# Patient Record
Sex: Female | Born: 2000 | Race: White | Hispanic: No | Marital: Single | Smoking: Never smoker
Health system: Southern US, Community
[De-identification: ages and names within clinical notes are randomized; demographics above are authoritative.]

## PROBLEM LIST (undated history)

## (undated) DIAGNOSIS — F633 Trichotillomania: Secondary | ICD-10-CM

## (undated) HISTORY — DX: Trichotillomania: F63.3

## (undated) HISTORY — PX: TYMPANOSTOMY TUBE PLACEMENT: SHX32

---
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
## 2019-03-02 ENCOUNTER — Telehealth: Payer: Self-pay | Admitting: *Deleted

## 2019-03-02 DIAGNOSIS — R300 Dysuria: Secondary | ICD-10-CM

## 2019-03-02 NOTE — Telephone Encounter (Signed)
Copied from [place] [id number]. Topic: Referral - Request for Referral >> Mar 02, 2019 12:27 PM Berneta Levins wrote: Has patient seen PCP for this complaint? yes *If NO, is insurance requiring patient see PCP for this issue before PCP can refer them? Referral for which specialty: urology Preferred provider/office: Bryna Colander at [facility] Reason for referral: having UTI symptoms but testing negative for UTI

## 2019-03-03 ENCOUNTER — Other Ambulatory Visit: Payer: Self-pay

## 2019-03-03 ENCOUNTER — Other Ambulatory Visit (HOSPITAL_COMMUNITY)
Admission: RE | Admit: 2019-03-03 | Discharge: 2019-03-03 | Disposition: A | Discharge status: Home / Self Care | Payer: No Typology Code available for payment source | Source: Ambulatory Visit | Attending: Family Medicine | Admitting: Family Medicine

## 2019-03-03 ENCOUNTER — Ambulatory Visit: Payer: No Typology Code available for payment source | Admitting: Family Medicine

## 2019-03-03 ENCOUNTER — Encounter: Payer: Self-pay | Admitting: Family Medicine

## 2019-03-03 VITALS — BP 108/70 | HR 95 | Temp 98.2°F | Wt 124.2 lb

## 2019-03-03 DIAGNOSIS — R300 Dysuria: Secondary | ICD-10-CM | POA: Diagnosis not present

## 2019-03-03 DIAGNOSIS — Z87440 Personal history of urinary (tract) infections: Secondary | ICD-10-CM | POA: Insufficient documentation

## 2019-03-03 LAB — POCT URINALYSIS DIPSTICK
Glucose, UA: POSITIVE — AB
Ketones, UA: NEGATIVE
Nitrite, UA: POSITIVE
Protein, UA: POSITIVE — AB
Spec Grav, UA: 1.015 (ref 1.010–1.025)
Urobilinogen, UA: 2 E.U./dL — AB
pH, UA: 5 (ref 5.0–8.0)

## 2019-03-03 LAB — POCT URINE PREGNANCY: Preg Test, Ur: NEGATIVE

## 2019-03-03 MED ORDER — CEFDINIR 300 MG PO CAPS: 300.0000 mg | ORAL_CAPSULE | Freq: Two times a day (BID) | ORAL | 0 refills | Status: DC

## 2019-03-03 NOTE — Progress Notes (Signed)
Subjective:    Patient ID: Teresa Harding, female    DOB: 2001-02-25, 18 y.o.   MRN: [id number]  HPI   Patient presents to clinic due to having painful urination for the past 2 or 3 days.  Has taken Azo to help combat this with some help in reducing symptoms.  Patient does have a history of recurrent UTIs.  Mom has history of interstitial cystitis.  Patient states she did see a urologist previously, but states she was just diagnosed with "bladder spasms".  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  No fever or chills.  No nausea, vomiting or diarrhea.  No severe abdominal pain.  Patient not currently sexually active, but has been previously.  At this time denies any known exposure to STDs.  Denies pregnancy possibility.  Patient Active Problem List   Diagnosis Date Noted  . Dysuria 08/09/2016   Social History   Tobacco Use  . Smoking status: Never Smoker  . Smokeless tobacco: Never Used  Substance Use Topics  . Alcohol use: No    Review of Systems   Constitutional: Negative for chills, fatigue and fever.  HENT: Negative for congestion, ear pain, sinus pain and sore throat.   Eyes: Negative.   Respiratory: Negative for cough, shortness of breath and wheezing.   Cardiovascular: Negative for chest pain, palpitations and leg swelling.  Gastrointestinal: Negative for abdominal pain, diarrhea, nausea and vomiting.  Genitourinary: Negative for dysuria, frequency and urgency.  Musculoskeletal: Negative for arthralgias and myalgias.  Skin: Negative for color change, pallor and rash.  Neurological: Negative for syncope, light-headedness and headaches.  Psychiatric/Behavioral: The patient is not nervous/anxious.       Objective:   Physical Exam Vitals signs and nursing note reviewed.  Constitutional:      General: She is not in acute distress.    Appearance: She is not ill-appearing or  toxic-appearing.  HENT:     Head: Normocephalic and atraumatic.  Cardiovascular:     Rate and Rhythm: Normal rate and regular rhythm.     Heart sounds: Normal heart sounds.  Pulmonary:     Effort: Pulmonary effort is normal. No respiratory distress.     Breath sounds: Normal breath sounds.  Abdominal:     General: Bowel sounds are normal. There is no distension.     Palpations: Abdomen is soft. There is no mass.     Tenderness: There is no abdominal tenderness. There is no guarding or rebound.     Hernia: No hernia is present.  Musculoskeletal:     Right lower leg: No edema.     Left lower leg: No edema.  Skin:    General: Skin is warm and dry.     Coloration: Skin is not jaundiced or pale.  Neurological:     Mental Status: She is alert and oriented to person, place, and time.     Gait: Gait normal.  Psychiatric:        Mood and Affect: Mood normal.        Behavior: Behavior normal.    Today's Vitals   03/03/19 1348  BP: 108/70  Pulse: 95  Temp: 98.2 F (36.8 C)  SpO2: 97%  Weight: 124 lb 3.2 oz (56.3 kg)       Assessment & Plan:     Recurrent UTI/dysuria-suspect patient may have interstitial cystitis.  Urinalysis is skewed by taking Azo, but  with her symptoms we will treat empirically with cefdinir twice daily for 5 days.  We will also send out testing for STDs.  Pregnancy is negative.  I will place referral to urology for further evaluation.  Also made patient aware that there is a urogynecology specialist we could consider in future if needed.  Aware that she will be contacted about Referral within 1 to 2 weeks and if she does not hear anything please let us know

## 2019-03-03 NOTE — Patient Instructions (Signed)
Interstitial Cystitis  Interstitial cystitis is inflammation of the bladder. This may cause pain in the bladder area as well as a frequent and urgent need to urinate. The bladder is a hollow organ in the lower part of the abdomen. It stores urine after the urine is made in the kidneys. The severity of interstitial cystitis can vary from person to person. You may have flare-ups, and then your symptoms may go away for a while. For many people, it becomes a long-term (chronic) problem. What are the causes? The cause of this condition is not known. What increases the risk? The following factors may make you more likely to develop this condition:  You are female.  You have fibromyalgia.  You have irritable bowel syndrome (IBS).  You have endometriosis. This condition may be aggravated by:  Stress.  Smoking.  Spicy foods. What are the signs or symptoms? Symptoms of interstitial cystitis vary, and they can change over time. Symptoms may include:  Discomfort or pain in the bladder area, which is in the lower abdomen. Pain can range from mild to severe. The pain may change in intensity as the bladder fills with urine or as it empties.  Pain in the pelvic area, between the hip bones.  An urgent need to urinate.  Frequent urination.  Pain during urination.  Pain during sex.  Blood in the urine. For women, symptoms often get worse during menstruation. How is this diagnosed? This condition is diagnosed based on your symptoms, your medical history, and a physical exam. You may have tests to rule out other conditions, such as:  Urine tests.  Cystoscopy. For this test, a tool similar to a very thin telescope is used to look into your bladder.  Biopsy. This involves taking a sample of tissue from the bladder to be examined under a microscope. How is this treated? There is no cure for this condition, but treatment can help you control your symptoms. Work closely with your health care  provider to find the most effective treatments for you. Treatment options may include:  Medicines to relieve pain and reduce how often you feel the need to urinate.  Learning ways to control when you urinate (bladder training).  Lifestyle changes, such as changing your diet or taking steps to control stress.  Using a device that provides electrical stimulation to your nerves, which can relieve pain (neuromodulation therapy). The device is placed on your back, where it blocks the nerves that cause you to feel pain in your bladder area.  A procedure that stretches your bladder by filling it with air or fluid.  Surgery. This is rare. It is only done for extreme cases, if other treatments do not help. Follow these instructions at home: Bladder training   Use bladder training techniques as directed. Techniques may include: ? Urinating at scheduled times. ? Training yourself to delay urination. ? Doing exercises (Kegel exercises) to strengthen the muscles that control urine flow.  Keep a bladder diary. ? Write down the times that you urinate and any symptoms that you have. This can help you find out which foods, liquids, or activities make your symptoms worse. ? Use your bladder diary to schedule bathroom trips. If you are away from home, plan to be near a bathroom at each of your scheduled times.  Make sure that you urinate just before you leave the house and just before you go to bed. Eating and drinking  Make dietary changes as recommended by your health care provider. You   may need to avoid: ? Spicy foods. ? Foods that contain a lot of potassium.  Limit your intake of beverages that make you need to urinate. These include: ? Caffeinated beverages like soda, coffee, and tea. ? Alcohol. General instructions  Take over-the-counter and prescription medicines only as told by your health care provider.  Do not drink alcohol.  You can try a warm or cool compress over your bladder for  comfort.  Avoid wearing tight clothing.  Do not use any products that contain nicotine or tobacco, such as cigarettes and e-cigarettes. If you need help quitting, ask your health care provider.  Keep all follow-up visits as told by your health care provider. This is important. Contact a health care provider if you have:  Symptoms that do not get better with treatment.  Pain or discomfort that gets worse.  More frequent urges to urinate.  A fever. Get help right away if:  You have no control over when you urinate. Summary  Interstitial cystitis is inflammation of the bladder.  This condition may cause pain in the bladder area as well as a frequent and urgent need to urinate.  You may have flare-ups of the condition, and then it may go away for a while. For many people, it becomes a long-term (chronic) problem.  There is no cure for interstitial cystitis, but treatment methods are available to control your symptoms. This information is not intended to replace advice given to you by your health care provider. Make sure you discuss any questions you have with your health care provider. Document Released: 12/16/2003 Document Revised: 03/29/2017 Document Reviewed: 03/11/2017 Elsevier Patient Education  2020 [employer]  

## 2019-03-03 NOTE — Telephone Encounter (Signed)
Referral placed. Please find out what symptoms she has been having. When was she last evaluated for a UTI?

## 2019-03-05 LAB — URINE CULTURE
MICRO NUMBER:: 1059458
Result:: NO GROWTH
SPECIMEN QUALITY:: ADEQUATE

## 2019-03-06 LAB — URINE CYTOLOGY ANCILLARY ONLY
Bacterial Vaginitis-Urine: NEGATIVE
Candida Urine: NEGATIVE
Chlamydia: NEGATIVE
Comment: NEGATIVE
Comment: NEGATIVE
Comment: NORMAL
Neisseria Gonorrhea: NEGATIVE
Trichomonas: NEGATIVE

## 2019-04-03 NOTE — Telephone Encounter (Signed)
Patient has appointment with urologist 12/9.

## 2019-04-08 ENCOUNTER — Ambulatory Visit (INDEPENDENT_AMBULATORY_CARE_PROVIDER_SITE_OTHER): Payer: No Typology Code available for payment source | Admitting: Urology

## 2019-04-08 ENCOUNTER — Other Ambulatory Visit: Payer: Self-pay

## 2019-04-08 VITALS — BP 113/77 | HR 93 | Ht 62.0 in | Wt 125.0 lb

## 2019-04-08 DIAGNOSIS — N941 Unspecified dyspareunia: Secondary | ICD-10-CM

## 2019-04-08 DIAGNOSIS — R801 Persistent proteinuria, unspecified: Secondary | ICD-10-CM | POA: Diagnosis not present

## 2019-04-08 DIAGNOSIS — R102 Pelvic and perineal pain: Secondary | ICD-10-CM

## 2019-04-08 DIAGNOSIS — Z87440 Personal history of urinary (tract) infections: Secondary | ICD-10-CM

## 2019-04-08 NOTE — Progress Notes (Signed)
04/08/2019 8:49 AM   Teresa Harding [birth year] [id number]  Referring provider: Leone Haven, MD [address] [place],   [address]  Chief Complaint  Patient presents with  . Recurrent UTI    HPI: 18 year old female referred for further evaluation of urinary symptoms.  She reports for the past several years, she has been experiencing dysuria and pelvic pain along with urinary frequency.  She was seen by pediatric urologist a few years ago at [facility] although the notes are not available for this.  She reports that she was diagnosed with bladder spasms and never really followed up.  She reports that she has burning with urination which often is associated and around the time of her menstrual cycle, a week before or after.  She reports that this burning is external and in her urethra.  She also has some discomfort/fullness right before she voids which often is relieved by voiding but hurts for a few minutes after.  She reports that she has been checked on multiple occasions for urinary tract infections but almost on every occasion, she does not actually have an infection.  She was seen and evaluated by her primary care physician on 03/2019 with urinary symptoms including dysuria.  Urinalysis at the time was a urine dip only which showed glucose, protein, urobilinogen, and large leukocytes with nitrate positive however the patient was taking Azo at the time.  No microscopic evaluation was performed.  Her urine culture was negative.  STI testing also negative.  She also pain with sexual intercourse.  She reports feeling pain in her adnexa bilaterally which is deep and intense with almost every sexual encounter involving penetration.  She is not currently sexually active but this can be bothersome to her.  She is currently menstruating and would prefer to defer pelvic exam today.  She denies constipation.  She does believe that she had gross hematuria once.  She showed  me pictures today of orange/reddish tinged urine with a small stringy clot on 12 fever.  No history of pyelonephritis or flank pain.  No history of kidney stones.   PMH: Past Medical History:  Diagnosis Date  . Trichotillomania     Surgical History: Past Surgical History:  Procedure Laterality Date  . TYMPANOSTOMY TUBE PLACEMENT      Home Medications:  Allergies as of 04/08/2019   No Known Allergies     Medication List       Accurate as of April 08, 2019 11:59 PM. If you have any questions, ask your nurse or doctor.        cefdinir 300 MG capsule Commonly known as: OMNICEF Take 1 capsule (300 mg total) by mouth 2 (two) times daily.   etonogestrel-ethinyl estradiol 0.12-0.015 MG/24HR vaginal ring Commonly known as: [place] 1 each vaginally every 28 (twenty-eight) days. Insert vaginally and leave in place for 3 consecutive weeks, then remove for 1 week.   ibuprofen 200 MG tablet Commonly known as: ADVIL Take 200 mg by mouth every 6 (six) hours as needed. For pain or fever       Allergies: No Known Allergies  Family History: Family History  Problem Relation Age of Onset  . Non-Hodgkin's lymphoma Maternal Grandfather   . Stroke Paternal Grandfather   . Heart attack Paternal Grandfather     Social History:  reports that she has never smoked. She has never used smokeless tobacco. She reports that she does not drink alcohol or use drugs.  ROS: UROLOGY Frequent Urination?:  Yes Hard to postpone urination?: No Burning/pain with urination?: Yes Get up at night to urinate?: No Leakage of urine?: No Urine stream starts and stops?: No Trouble starting stream?: No Do you have to strain to urinate?: No Blood in urine?: No Urinary tract infection?: No Sexually transmitted disease?: No Injury to kidneys or bladder?: No Painful intercourse?: Yes Weak stream?: No Currently pregnant?: No Vaginal bleeding?: No Last menstrual period?: n  Gastrointestinal  Nausea?: No Vomiting?: No Indigestion/heartburn?: No Diarrhea?: No Constipation?: No  Constitutional Fever: No Night sweats?: No Weight loss?: No Fatigue?: No  Skin Skin rash/lesions?: No Itching?: No  Eyes Blurred vision?: No Double vision?: No  Ears/Nose/Throat Sore throat?: No Sinus problems?: No  Hematologic/Lymphatic Swollen glands?: No Easy bruising?: No  Cardiovascular Leg swelling?: No Chest pain?: No  Respiratory Cough?: No Shortness of breath?: No  Endocrine Excessive thirst?: No  Musculoskeletal Back pain?: No Joint pain?: No  Neurological Headaches?: No Dizziness?: No  Psychologic Depression?: No Anxiety?: No  Physical Exam: BP 113/77   Pulse 93   Ht 5\' 2"  (1.575 m)   Wt 125 lb (56.7 kg)   BMI 22.86 kg/m   Constitutional:  Alert and oriented, No acute distress. HEENT: [place] AT, moist mucus membranes.  Trachea midline, no masses. Cardiovascular: No clubbing, cyanosis, or edema. Respiratory: Normal respiratory effort, no increased work of breathing. GI: Abdomen is soft, nontender, nondistended, no abdominal masses Skin: No rashes, bruises or suspicious lesions. Neurologic: Grossly intact, no focal deficits, moving all 4 extremities. Psychiatric: Normal mood and affect.  Urinalysis Results for orders placed or performed in visit on 04/08/19  Microscopic Examination   URINE  Result Value Ref Range   WBC, UA 0-5 0 - 5 /hpf   RBC None seen 0 - 2 /hpf   Epithelial Cells (non renal) 0-10 0 - 10 /hpf   Mucus, UA Present (A) Not Estab.   Bacteria, UA Few None seen/Few  Urinalysis, Complete  Result Value Ref Range   Specific Gravity, UA >1.030 (H) 1.005 - 1.030   pH, UA 5.5 5.0 - 7.5   Color, UA Yellow Yellow   Appearance Ur Cloudy (A) Clear   Leukocytes,UA Negative Negative   Protein,UA 1+ (A) Negative/Trace   Glucose, UA Negative Negative   Ketones, UA Negative Negative   RBC, UA 1+ (A) Negative   Bilirubin, UA Negative Negative    Urobilinogen, Ur 0.2 0.2 - 1.0 mg/dL   Nitrite, UA Negative Negative   Microscopic Examination See below:     Pertinent Imaging: n/a  Assessment & Plan:    1. History of recurrent UTIs Based on above history and review of multiple urinalysis/urine culture, do not feel that she likely has true urinary tract infections.  See below.  We discussed that obtaining UA/urine culture when she is symptomatic can be helpful in ruling the above out as underlying issue.  I encouraged her to pursue same-day visits to our office when she think she is having urinary tract infection to help rule this out as underlying cause.  She is agreeable this plan.  Weight today is fairly unremarkable. - Urinalysis, Complete  2. Pelvic pain in female/ dysuria/ possible IC We discussed the diease pathophysiology is poorly understood therefore treatment has been focused primarily on symptomatic relief as well as dietary and behavioral modification. Information pamphlets were reviewed and given today discussing the current understanding of the syndrome as well as treatment options. IC dietary information also given today as many patients experience relief  with simple lifestyle modifications.     If conservative management fails, will consider further work up with cystoscopy to access for Hunter's ulcers or other more aggressive treatments, however, response to each of these interventions is highly variable.   She has been using Pyridium as needed.  We discussed transition to Uribel which has some antispasmodic properties as well when can be used more frequently.  Samples were given today and prescription was called into the pharmacy.  We will escalate treatments if her symptoms worsen or do not respond to the above interventions.   3. Dyspareunia in female Plan for pelvic exam to rule out any underlying issues-deferred today due to menstruation  May be related to #2  Not currently sexually active thus will hold  off on any further intervention/treatment at this time but may consider pelvic floor therapy versus vaginal Valium down the road to see if this makes any difference  4. Persistent proteinuria Proteinuria on urine dip today which has also been noted on multiple previous urinalysis  Unclear whether or not this may be related to contamination and thus will obtain a cath specimen of time of pelvic, consider referral to nephrologist if she has any significant protein on that specimen   Return in about 1 month (around 05/09/2019) for Follow up with shannon or sam wit a cath  and pelvic.  Vanna Scotland, MD  [facility] [address], [address] [place], [place] [address] [phone]

## 2019-04-09 LAB — URINALYSIS, COMPLETE
Bilirubin, UA: NEGATIVE
Glucose, UA: NEGATIVE
Ketones, UA: NEGATIVE
Leukocytes,UA: NEGATIVE
Nitrite, UA: NEGATIVE
Specific Gravity, UA: >1.03 — ABNORMAL HIGH (ref 1.005–1.030)
Urobilinogen, Ur: 0.2 mg/dL (ref 0.2–1.0)
pH, UA: 5.5 (ref 5.0–7.5)

## 2019-04-09 LAB — MICROSCOPIC EXAMINATION: RBC: NONE SEEN /hpf (ref 0–2)

## 2019-04-10 MED ORDER — URIBEL 118 MG PO CAPS: 1.0000 | ORAL_CAPSULE | Freq: Four times a day (QID) | ORAL | 1 refills | Status: AC | PRN

## 2019-04-21 ENCOUNTER — Encounter: Payer: Self-pay | Admitting: Physician Assistant

## 2019-04-21 ENCOUNTER — Other Ambulatory Visit: Payer: Self-pay

## 2019-04-21 ENCOUNTER — Ambulatory Visit (INDEPENDENT_AMBULATORY_CARE_PROVIDER_SITE_OTHER): Payer: No Typology Code available for payment source | Admitting: Physician Assistant

## 2019-04-21 VITALS — BP 127/78 | HR 99 | Ht 62.0 in | Wt 125.0 lb

## 2019-04-21 DIAGNOSIS — Z87440 Personal history of urinary (tract) infections: Secondary | ICD-10-CM

## 2019-04-21 DIAGNOSIS — R801 Persistent proteinuria, unspecified: Secondary | ICD-10-CM | POA: Diagnosis not present

## 2019-04-21 DIAGNOSIS — R3915 Urgency of urination: Secondary | ICD-10-CM

## 2019-04-21 DIAGNOSIS — R102 Pelvic and perineal pain: Secondary | ICD-10-CM | POA: Diagnosis not present

## 2019-04-21 LAB — MICROSCOPIC EXAMINATION
Bacteria, UA: NONE SEEN
RBC: NONE SEEN /hpf (ref 0–2)

## 2019-04-21 LAB — URINALYSIS, COMPLETE
Bilirubin, UA: NEGATIVE
Glucose, UA: NEGATIVE
Ketones, UA: NEGATIVE
Leukocytes,UA: NEGATIVE
Nitrite, UA: NEGATIVE
Protein,UA: NEGATIVE
RBC, UA: NEGATIVE
Specific Gravity, UA: 1.025 (ref 1.005–1.030)
Urobilinogen, Ur: 0.2 mg/dL (ref 0.2–1.0)
pH, UA: 5.5 (ref 5.0–7.5)
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
## 2019-04-24 LAB — CULTURE, URINE COMPREHENSIVE

## 2019-05-11 ENCOUNTER — Ambulatory Visit: Payer: No Typology Code available for payment source | Attending: Physician Assistant

## 2019-05-11 ENCOUNTER — Other Ambulatory Visit: Payer: Self-pay

## 2019-05-11 DIAGNOSIS — M62838 Other muscle spasm: Secondary | ICD-10-CM

## 2019-05-11 DIAGNOSIS — M533 Sacrococcygeal disorders, not elsewhere classified: Secondary | ICD-10-CM | POA: Diagnosis present

## 2019-05-11 NOTE — Patient Instructions (Signed)
Intimate Rose internal trigger point release tool [phone]  Dr. Alonna Minium Premium Prostate Massager   $19.99  Stabilization: Diaphragmatic Breathing    Lie with knees bent, feet flat. Place one hand on stomach, other on chest. Breathe deeply through nose, lifting belly hand without any motion of hand on chest. For ~ 5 min. Per night and throughout the day as much as you can remember, especially when you feel stressed.
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
## 2019-05-18 ENCOUNTER — Other Ambulatory Visit: Payer: Self-pay

## 2019-05-18 ENCOUNTER — Ambulatory Visit: Payer: No Typology Code available for payment source

## 2019-05-18 DIAGNOSIS — M62838 Other muscle spasm: Secondary | ICD-10-CM

## 2019-05-18 DIAGNOSIS — M533 Sacrococcygeal disorders, not elsewhere classified: Secondary | ICD-10-CM
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
## 2019-05-18 NOTE — Patient Instructions (Signed)
   Hold for 30 seconds (5 deep breaths) and repeat 2-3 times on each side once a day   Flexors, Lunge  Hip Flexor Stretch: Proposal Pose    Maintain pelvic tuck under, lift pubic bone toward navel. Engage posterior hip muscles (firm glute muscles of leg in back position) and shift forward until you feel stretch on front of leg that is down. To increase stretch, maintain balance and ease hips forward. You may use one hand on a chair for balance if needed. Hold for __5__ breaths. Repeat __2-3__ times each leg.  Do _1-2__ times per day.    When seated, you want to maintain pelvic neutral with the shoulders gently down and back and ears in line with your shoulders. A lumbar roll such as the one below or a home-made towel-roll can be used for this purpose. Even Olympic athletes can only maintain proper seated posture for about 10 minutes without support!    Pictured: The Original [employer] Lumbar Roll

## 2019-05-25 ENCOUNTER — Other Ambulatory Visit: Payer: Self-pay

## 2019-05-25 ENCOUNTER — Ambulatory Visit: Payer: No Typology Code available for payment source

## 2019-05-25 DIAGNOSIS — M62838 Other muscle spasm: Secondary | ICD-10-CM | POA: Diagnosis not present

## 2019-05-25 DIAGNOSIS — M533 Sacrococcygeal disorders, not elsewhere classified: Secondary | ICD-10-CM
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
## 2019-05-25 NOTE — Patient Instructions (Addendum)
Self External Trigger Point Relief    1) Wash your hands and prop yourself up in a way where you can easily reach the vagina. You may wish to have a small hand-held mirror near by.  2) Use the 2 middle fingers to put gentle pressure on the three external pelvic floor muscles and hold pressure and take deep breaths as you allow the tension to release and discomfort to dissipate   3) Repeat the process for any trigger points you find spending between 3-10 minutes on this every 1-2 days until you do not find any more trigger points or you are told otherwise by your therapist.  Self Internal Trigger Point Relief    1) Wash your hands and prop yourself up in a way where you can easily reach the vagina. You may wish to have a small hand-held mirror near by.  2) lubricate the tool and vaginal opening using a hypoallergenic lubricant such as "slippery-stuff".   3) Slowly and gently insert the tool into the vagina using deep breaths to allow relaxation of the muscles around the tool.  4) Avoiding the "12 o-clock" region near the urethra, gently use the handle of the tool like a lever to press the angled tip of the tool onto the wall of the pelvic floor.   5) Move the tool to different areas of the pelvic floor and feel for areas that are tender called "trigger points". When you find one hold the tool still, applying just enough pressure to elicit mild discomfort and take deep belly breaths until the discomfort subsides or decreases by at least 50%.   6) Repeat the process for any trigger points you find spending between 3-10 minutes on this per night until you do not find any more trigger points or you are told otherwise by your therapist..  3-Way Wall Stretches for Pelvic Floor Lengthening   Bring bottom close to the wall and gently press straighten knees to feel a stretch down the back of you thighs. Hold while taking 5 deep belly breaths and feeling the pelvic floor relax and lower on each  inhale.    Let your legs fall to the side to feel a stretch on the inside of your thighs. If the stretch is too intense you can use a pillow to take some of the weight off by wedging it on the outside of your hips. Hold while taking 5 deep belly breaths and feeling the pelvic floor relax and lower on each inhale.    Slide feet down the wall and move hips slightly away from the wall and then let your knees fall to the sides so you feel a stretch on the inside of the thighs near your groin. Hold while taking 5 deep belly breaths and feeling the pelvic floor relax and lower on each inhale.     *Perform each stretch in sequence 3 times, once a day

## 2019-06-01 ENCOUNTER — Other Ambulatory Visit: Payer: Self-pay

## 2019-06-01 ENCOUNTER — Ambulatory Visit: Payer: PRIVATE HEALTH INSURANCE | Attending: Physician Assistant

## 2019-06-01 DIAGNOSIS — M533 Sacrococcygeal disorders, not elsewhere classified: Secondary | ICD-10-CM | POA: Diagnosis present

## 2019-06-01 DIAGNOSIS — M62838 Other muscle spasm: Secondary | ICD-10-CM | POA: Diagnosis present
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
## 2019-06-08 ENCOUNTER — Ambulatory Visit: Payer: PRIVATE HEALTH INSURANCE

## 2019-06-08 ENCOUNTER — Other Ambulatory Visit: Payer: Self-pay

## 2019-06-08 DIAGNOSIS — M62838 Other muscle spasm: Secondary | ICD-10-CM

## 2019-06-08 DIAGNOSIS — M533 Sacrococcygeal disorders, not elsewhere classified: Secondary | ICD-10-CM

## 2019-06-08 NOTE — Patient Instructions (Signed)
Urge supression technique:  1) Take a deep breath to convince yourself that you are in control and calm the nervous system.  2) Do 5 "quick-flick" kegels (pelvic floor muscle contractions) and re-assess the urge. Repeat another set if urge is still present. 3) Once the urge has decreased, start walking calmly to the the bathroom. Stop and repeat steps 1 and 2 as many times as needed until you can successfully get to the toilet. 4) Only once seated, take a deep breath and allow the pelvic floor muscles to relax and allow for the urine to flow.    Do not be discouraged if you are not successful the first couple times, this is normal and it will take practice but remember that YOU are in control. Start by practicing this at home where you do not have to worry as much if there were to be an accident. Allowing yourself to get rushed or nervous puts the bladder back in control and will not allow the technique to work.   **try to dampen the "1st urge, always listen to the 2nd urge" Normal is every 2-3 hours.
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
## 2019-06-15 ENCOUNTER — Other Ambulatory Visit: Payer: Self-pay

## 2019-06-15 ENCOUNTER — Ambulatory Visit: Payer: PRIVATE HEALTH INSURANCE

## 2019-06-15 DIAGNOSIS — M533 Sacrococcygeal disorders, not elsewhere classified: Secondary | ICD-10-CM

## 2019-06-15 DIAGNOSIS — M62838 Other muscle spasm: Secondary | ICD-10-CM | POA: Diagnosis not present
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
## 2019-06-22 ENCOUNTER — Ambulatory Visit: Payer: PRIVATE HEALTH INSURANCE

## 2019-06-22 ENCOUNTER — Other Ambulatory Visit: Payer: Self-pay

## 2019-06-22 DIAGNOSIS — M62838 Other muscle spasm: Secondary | ICD-10-CM

## 2019-06-22 DIAGNOSIS — M533 Sacrococcygeal disorders, not elsewhere classified: Secondary | ICD-10-CM

## 2019-06-22 NOTE — Patient Instructions (Signed)
1) Make sure that your low back stays in contact with the floor/mat when you do any abdominal exercise on your back so you use your deep-core as well as your "show-muscles".  2) Keep your trunk as one unit and let it hinge forward from the hips as you push your bottom back and bend your knees at the same rate that you bend your hips. Keep your weight back toward your heels but do not actually lift the toes off the ground. Exhale starting just before and all the way through standing to help engage the glutes and lower tummy muscles.
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
## 2019-06-29 ENCOUNTER — Ambulatory Visit: Payer: No Typology Code available for payment source | Attending: Physician Assistant

## 2019-06-29 DIAGNOSIS — M533 Sacrococcygeal disorders, not elsewhere classified: Secondary | ICD-10-CM | POA: Insufficient documentation

## 2019-06-29 DIAGNOSIS — M62838 Other muscle spasm: Secondary | ICD-10-CM | POA: Insufficient documentation

## 2019-07-06 ENCOUNTER — Ambulatory Visit: Payer: No Typology Code available for payment source

## 2019-07-06 ENCOUNTER — Other Ambulatory Visit: Payer: Self-pay

## 2019-07-06 DIAGNOSIS — M533 Sacrococcygeal disorders, not elsewhere classified: Secondary | ICD-10-CM | POA: Diagnosis present

## 2019-07-06 DIAGNOSIS — M62838 Other muscle spasm: Secondary | ICD-10-CM | POA: Diagnosis present

## 2019-07-06 NOTE — Patient Instructions (Signed)
  Take this at least while you are taking the antibiotic to prevent UTI.
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
## 2019-07-13 ENCOUNTER — Other Ambulatory Visit: Payer: Self-pay

## 2019-07-13 ENCOUNTER — Ambulatory Visit: Payer: No Typology Code available for payment source

## 2019-07-13 DIAGNOSIS — M62838 Other muscle spasm: Secondary | ICD-10-CM

## 2019-07-13 DIAGNOSIS — M533 Sacrococcygeal disorders, not elsewhere classified: Secondary | ICD-10-CM
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
## 2019-07-13 NOTE — Patient Instructions (Signed)
~   20 min. At night   Hold for 5 seep breaths, repeat 2-3 times.   Hold for 5 deep breaths, repeat 2-3 times on each side.     Roll back and forth immediately after a run to help flush the lactic acid out. Hold on any area that feels tight/tender for long enough for the tendersess to decrease at least 50% (or all the way if not longer than ~ 3 min.)  Psychologytoday.[email or web address]  Listen to your speed of speech and for higher tone as well as having to stop to take a deep breath while speaking as signs of increased stress.     Start by leaning forward as a single unit then "catching" yourself to find the angle of forward lean that is appropriate. Minimize "bobbing" up and down and take faster, slightly shorter steps as if you are just repeatedly catching yourself as you lean forward rather than trying to "push" yourself forward.

## 2019-07-21 ENCOUNTER — Ambulatory Visit: Payer: No Typology Code available for payment source | Admitting: Urology

## 2019-08-04 ENCOUNTER — Ambulatory Visit: Payer: No Typology Code available for payment source | Attending: Physician Assistant

## 2019-08-04 ENCOUNTER — Other Ambulatory Visit: Payer: Self-pay

## 2019-08-04 DIAGNOSIS — M533 Sacrococcygeal disorders, not elsewhere classified: Secondary | ICD-10-CM

## 2019-08-04 DIAGNOSIS — M62838 Other muscle spasm: Secondary | ICD-10-CM | POA: Diagnosis present
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
## 2019-11-20 ENCOUNTER — Emergency Department (HOSPITAL_COMMUNITY)
Admission: EM | Admit: 2019-11-20 | Discharge: 2019-11-20 | Disposition: A | Discharge status: Home / Self Care | Payer: No Typology Code available for payment source | Attending: Emergency Medicine | Admitting: Emergency Medicine

## 2019-11-20 DIAGNOSIS — N3000 Acute cystitis without hematuria: Secondary | ICD-10-CM

## 2019-11-20 DIAGNOSIS — R102 Pelvic and perineal pain: Secondary | ICD-10-CM | POA: Diagnosis present

## 2019-11-20 DIAGNOSIS — T7421XA Adult sexual abuse, confirmed, initial encounter: Secondary | ICD-10-CM

## 2019-11-20 LAB — URINALYSIS, ROUTINE W REFLEX MICROSCOPIC
Bilirubin Urine: NEGATIVE
Glucose, UA: NEGATIVE mg/dL
Hgb urine dipstick: NEGATIVE
Ketones, ur: 5 mg/dL — AB
Nitrite: POSITIVE — AB
Protein, ur: NEGATIVE mg/dL
Specific Gravity, Urine: 1.021 (ref 1.005–1.030)
pH: 7 (ref 5.0–8.0)

## 2019-11-20 LAB — WET PREP, GENITAL
Clue Cells Wet Prep HPF POC: NONE SEEN
Sperm: NONE SEEN
Trich, Wet Prep: NONE SEEN
Yeast Wet Prep HPF POC: NONE SEEN

## 2019-11-20 LAB — RAPID HIV SCREEN (HIV 1/2 AB+AG)
HIV 1/2 Antibodies: NONREACTIVE
HIV-1 P24 Antigen - HIV24: NONREACTIVE

## 2019-11-20 LAB — PREGNANCY, URINE: Preg Test, Ur: NEGATIVE

## 2019-11-20 LAB — POC URINE PREG, ED: Preg Test, Ur: NEGATIVE

## 2019-11-20 MED ORDER — CEPHALEXIN 500 MG PO CAPS: 500.0000 mg | ORAL_CAPSULE | Freq: Two times a day (BID) | ORAL | 0 refills | Status: AC

## 2019-11-20 MED ORDER — ACETAMINOPHEN 500 MG PO TABS
1000.0000 mg | ORAL_TABLET | Freq: Once | ORAL | Status: AC
  Administered 2019-11-20: 1000 mg via ORAL
  Filled 2019-11-20: qty 2
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
## 2019-11-20 NOTE — ED Notes (Signed)
Spoke to [employer] RN who states pt ok with to eat/drink from her standpoint.
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
## 2019-11-20 NOTE — SANE Note (Signed)
The SANE/FNE (Forensic Nurse Examiner) consult has been completed. The primary RN and/or provider have been notified. Please contact the SANE/FNE nurse on call (listed in Amion) with any further concerns.  

## 2019-11-23 LAB — GC/CHLAMYDIA PROBE AMP (~~LOC~~) NOT AT ARMC
Chlamydia: NEGATIVE
Comment: NEGATIVE
Comment: NORMAL
Neisseria Gonorrhea: NEGATIVE

## 2023-07-10 ENCOUNTER — Telehealth: Payer: Self-pay | Admitting: Family Medicine

## 2023-07-10 NOTE — Telephone Encounter (Signed)
 Dr Birdie Sons is no longer at this location. Please call the office to schedule a Transfer of Care to either Dr Charlann Lange, Darleen Crocker or Kara Dies, NP. Purvis Sheffield please schedule TOC for this patient. [facility]   Thank you
